# Patient Record
Sex: Female | Born: 1965 | Smoking: Former smoker
Health system: Southern US, Community
[De-identification: ages and names within clinical notes are randomized; demographics above are authoritative.]

---

## 2019-05-31 ENCOUNTER — Other Ambulatory Visit: Payer: Self-pay | Admitting: Infectious Diseases

## 2019-05-31 DIAGNOSIS — Z1231 Encounter for screening mammogram for malignant neoplasm of breast: Secondary | ICD-10-CM

## 2019-08-01 ENCOUNTER — Ambulatory Visit
Admission: RE | Admit: 2019-08-01 | Discharge: 2019-08-01 | Disposition: A | Discharge status: Home / Self Care | Payer: 59 | Source: Ambulatory Visit | Attending: Infectious Diseases | Admitting: Infectious Diseases

## 2019-08-01 DIAGNOSIS — Z1231 Encounter for screening mammogram for malignant neoplasm of breast: Secondary | ICD-10-CM | POA: Diagnosis present

## 2019-08-16 ENCOUNTER — Other Ambulatory Visit: Payer: Self-pay | Admitting: Infectious Diseases

## 2019-08-16 DIAGNOSIS — R928 Other abnormal and inconclusive findings on diagnostic imaging of breast: Secondary | ICD-10-CM

## 2019-08-16 DIAGNOSIS — N6489 Other specified disorders of breast: Secondary | ICD-10-CM

## 2019-08-31 ENCOUNTER — Ambulatory Visit: Payer: No Typology Code available for payment source

## 2019-08-31 ENCOUNTER — Other Ambulatory Visit: Payer: 59

## 2019-09-14 ENCOUNTER — Other Ambulatory Visit: Payer: Self-pay

## 2019-09-29 ENCOUNTER — Ambulatory Visit
Admission: RE | Admit: 2019-09-29 | Discharge: 2019-09-29 | Disposition: A | Discharge status: Home / Self Care | Payer: No Typology Code available for payment source | Source: Ambulatory Visit | Attending: Infectious Diseases | Admitting: Infectious Diseases

## 2019-09-29 DIAGNOSIS — N6489 Other specified disorders of breast: Secondary | ICD-10-CM

## 2019-09-29 DIAGNOSIS — R928 Other abnormal and inconclusive findings on diagnostic imaging of breast: Secondary | ICD-10-CM | POA: Diagnosis not present

## 2020-07-09 ENCOUNTER — Telehealth: Payer: Self-pay

## 2020-07-09 NOTE — Telephone Encounter (Signed)
Contacted patient for lung CT screening clinic based on referral from Dr. Sampson Goon.  Message left for patient to call Glenna Fellows, lung navigator to schedule CT scan.

## 2020-07-23 ENCOUNTER — Telehealth: Payer: Self-pay

## 2020-07-23 NOTE — Telephone Encounter (Signed)
Contacted patient for lung CT screening program based on referral from Dr. Sampson Goon.  Message left for patient to call Glenna Fellows, lung navigator to schedule CT scan appt.

## 2020-07-30 ENCOUNTER — Telehealth: Payer: Self-pay

## 2020-07-30 DIAGNOSIS — Z87891 Personal history of nicotine dependence: Secondary | ICD-10-CM

## 2020-07-30 DIAGNOSIS — Z122 Encounter for screening for malignant neoplasm of respiratory organs: Secondary | ICD-10-CM

## 2020-07-30 NOTE — Telephone Encounter (Signed)
Contacted patient for lung CT screening program as referred by Dr. Sampson Goon.  I spoke to patient and she is agreeable and CT has been scheduled for Wed. Dec 22 at 10:15.  Address to imaging center, phone number to Glenna Fellows, lung navigator and appt day and time will be texted to patient as she is currently driving.  Patient confirms she has [employer].  Patient is a former smoker who started smoking consistently at age 54.  She states she started "sneaking" her mom's cigarettes at age 17.  She smoked 2 to 2 1/2 packs per day, but closer to 2 on average.  She stopped smoking 5 years ago.

## 2020-07-31 NOTE — Telephone Encounter (Signed)
Former smoker, quit 2016, 76 pack year history.

## 2020-07-31 NOTE — Addendum Note (Signed)
Addended by: Jonne Ply on: 07/31/2020 09:30 AM   Modules accepted: Orders

## 2020-08-29 ENCOUNTER — Ambulatory Visit
Admission: RE | Admit: 2020-08-29 | Discharge: 2020-08-29 | Disposition: A | Discharge status: Home / Self Care | Payer: No Typology Code available for payment source | Source: Ambulatory Visit | Attending: Oncology | Admitting: Oncology

## 2020-08-29 ENCOUNTER — Other Ambulatory Visit: Payer: Self-pay

## 2020-08-29 ENCOUNTER — Inpatient Hospital Stay: Payer: No Typology Code available for payment source | Attending: Oncology | Admitting: Oncology

## 2020-08-29 ENCOUNTER — Encounter: Payer: Self-pay | Admitting: Oncology

## 2020-08-29 DIAGNOSIS — Z87891 Personal history of nicotine dependence: Secondary | ICD-10-CM | POA: Diagnosis not present

## 2020-08-29 DIAGNOSIS — Z122 Encounter for screening for malignant neoplasm of respiratory organs: Secondary | ICD-10-CM

## 2020-08-29 NOTE — Progress Notes (Signed)
Virtual Visit via Video Note  I connected with Stefanie Ramsey on 08/29/20 at 10:15 AM EST by a video enabled telemedicine application and verified that I am speaking with the correct person using two identifiers.  Location: Patient: [facility] Provider: Clinic    I discussed the limitations of evaluation and management by telemedicine and the availability of in person appointments. The patient expressed understanding and agreed to proceed.  I discussed the assessment and treatment plan with the patient. The patient was provided an opportunity to ask questions and all were answered. The patient agreed with the plan and demonstrated an understanding of the instructions.   The patient was advised to call back or seek an in-person evaluation if the symptoms worsen or if the condition fails to improve as anticipated.   In accordance with CMS guidelines, patient has met eligibility criteria including age, absence of signs or symptoms of lung cancer.  Social History   Tobacco Use  . Smoking status: Former Smoker    Packs/day: 2.00    Years: 38.00    Pack years: 76.00    Types: Cigarettes    Quit date: 2016    Years since quitting: 5.9      A shared decision-making session was conducted prior to the performance of CT scan. This includes one or more decision aids, includes benefits and harms of screening, follow-up diagnostic testing, over-diagnosis, false positive rate, and total radiation exposure.   Counseling on the importance of adherence to annual lung cancer LDCT screening, impact of co-morbidities, and ability or willingness to undergo diagnosis and treatment is imperative for compliance of the program.   Counseling on the importance of continued smoking cessation for former smokers; the importance of smoking cessation for current smokers, and information about tobacco cessation interventions have been given to patient including [place] and [facility] quit [place] programs.   Written  order for lung cancer screening with LDCT has been given to the patient and any and all questions have been answered to the best of my abilities.    Yearly follow up will be coordinated by Burgess Estelle, Thoracic Navigator.  I provided 15 minutes of non face-to-face telephone visit time during this encounter, and > 50% was spent counseling as documented under my assessment & plan.   Jacquelin Hawking, NP

## 2020-09-03 ENCOUNTER — Encounter: Payer: Self-pay | Admitting: *Deleted

## 2020-11-05 ENCOUNTER — Other Ambulatory Visit: Payer: Self-pay | Admitting: Infectious Diseases

## 2020-11-05 DIAGNOSIS — Z1231 Encounter for screening mammogram for malignant neoplasm of breast: Secondary | ICD-10-CM

## 2020-12-30 IMAGING — MG DIGITAL SCREENING BILAT W/ TOMO W/ CAD
6 of 10 series · 6 of 30 positions shown · non-contrast
Comparison: None could be obtained.

CLINICAL DATA: Screening.

EXAM:
DIGITAL SCREENING BILATERAL MAMMOGRAM WITH TOMO AND CAD

[R MLO synth-2D (1 of 2)]
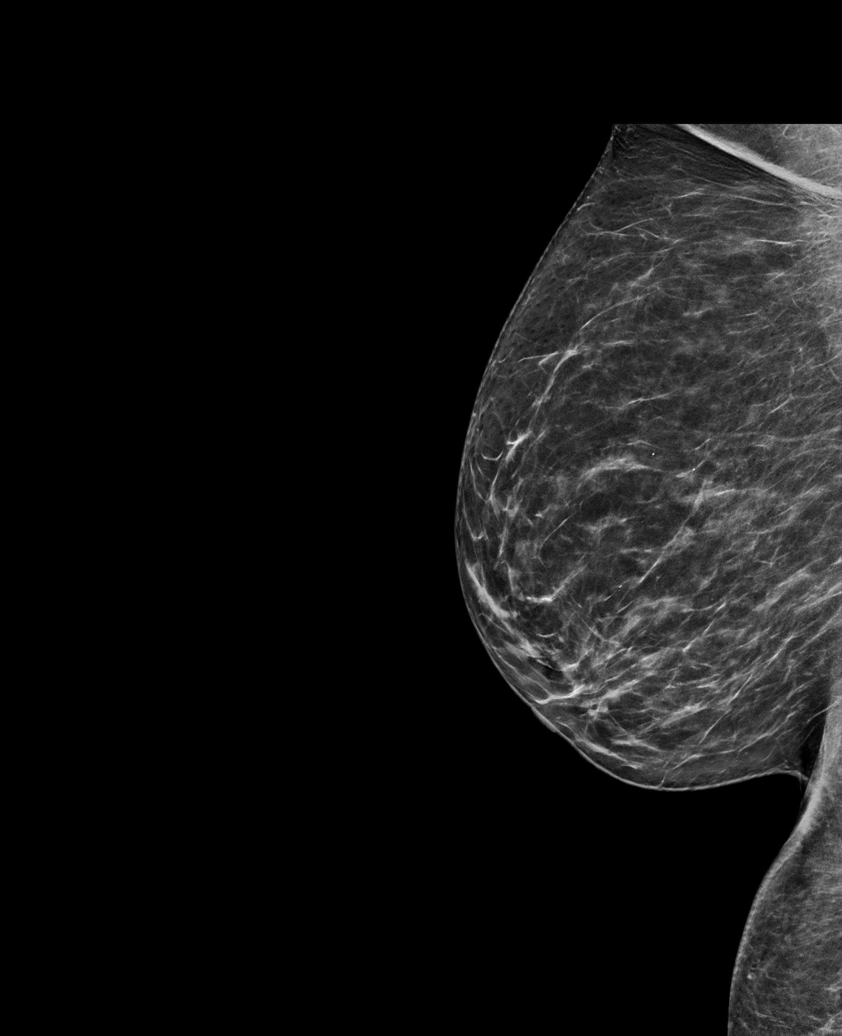

[R CC synth-2D]
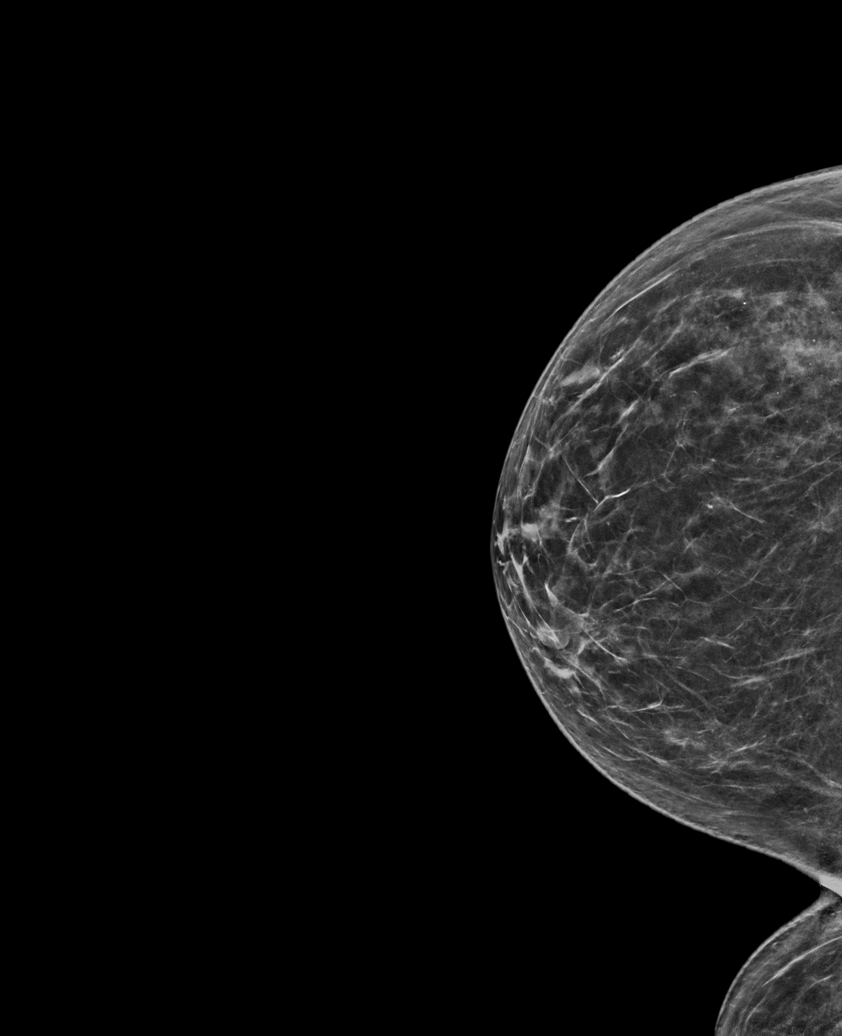

[L CC synth-2D]
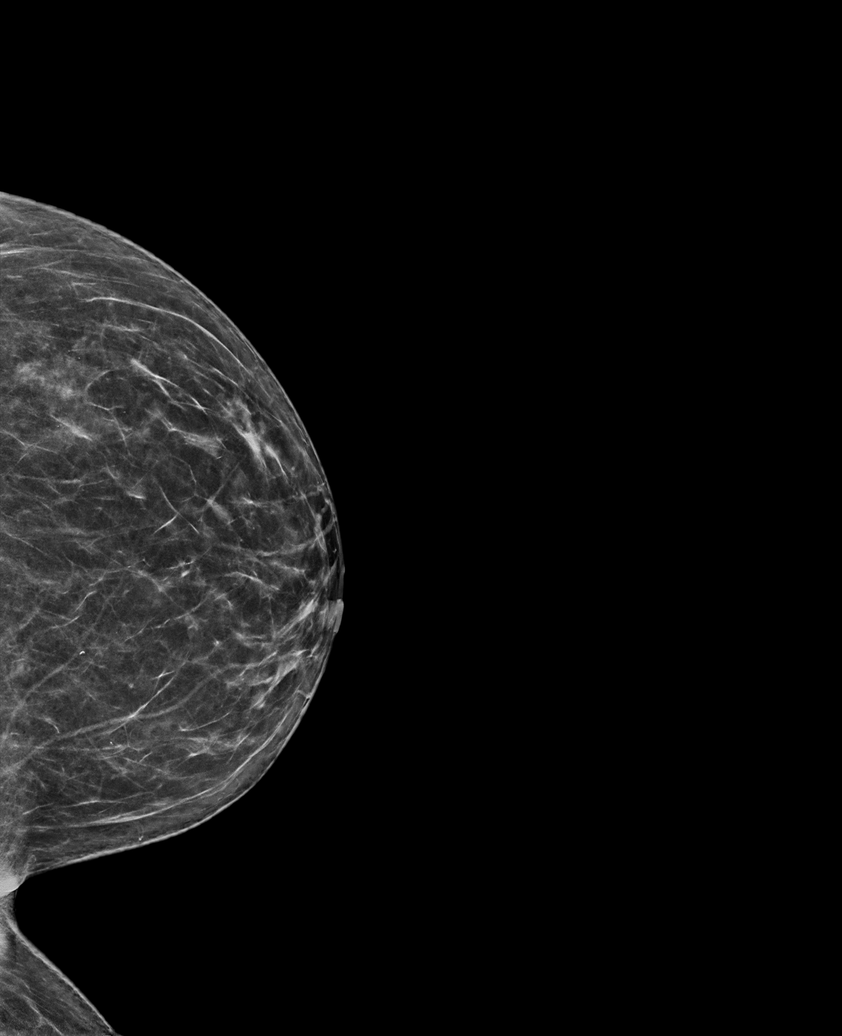

[L MLO synth-2D]
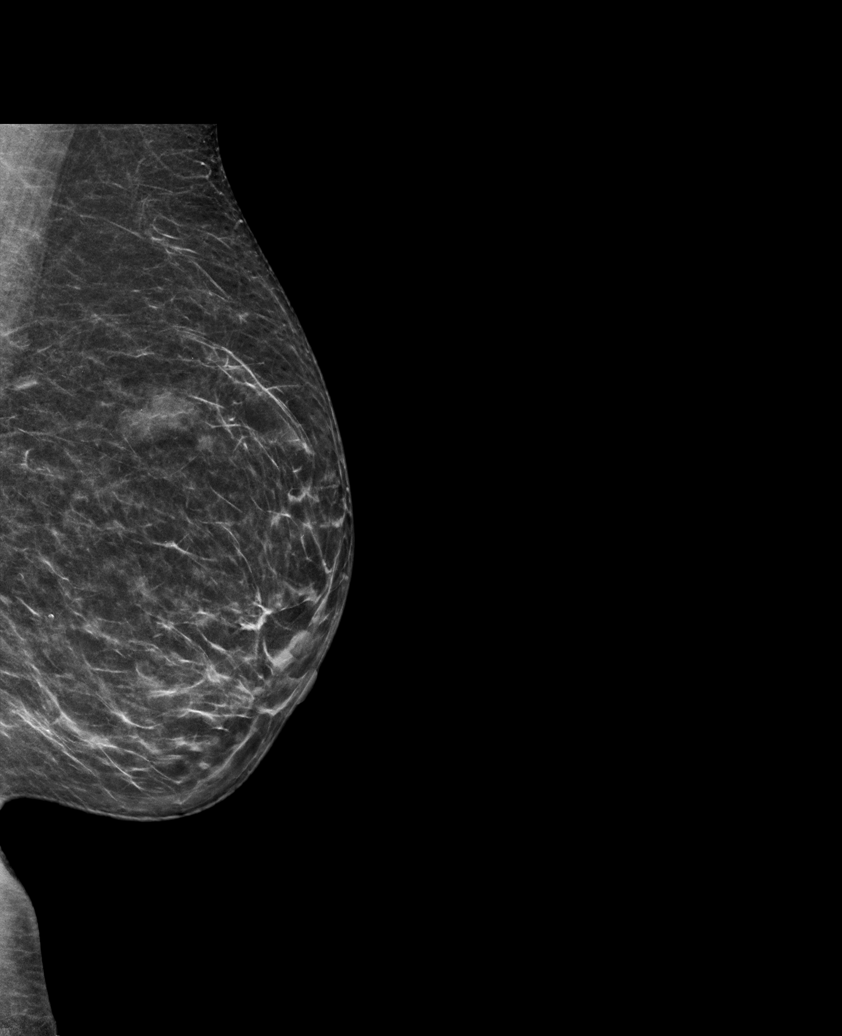

[R MLO synth-2D (2 of 2)]
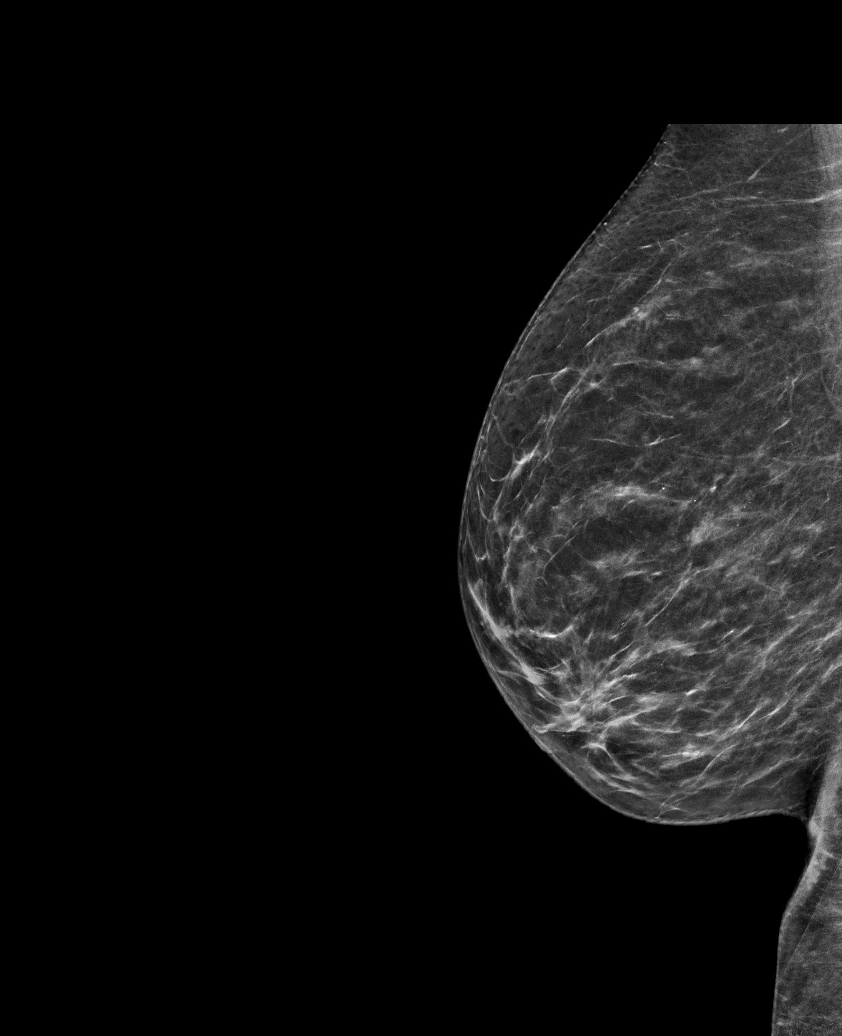

[R CC tomo · tomo slice 34/67.0]
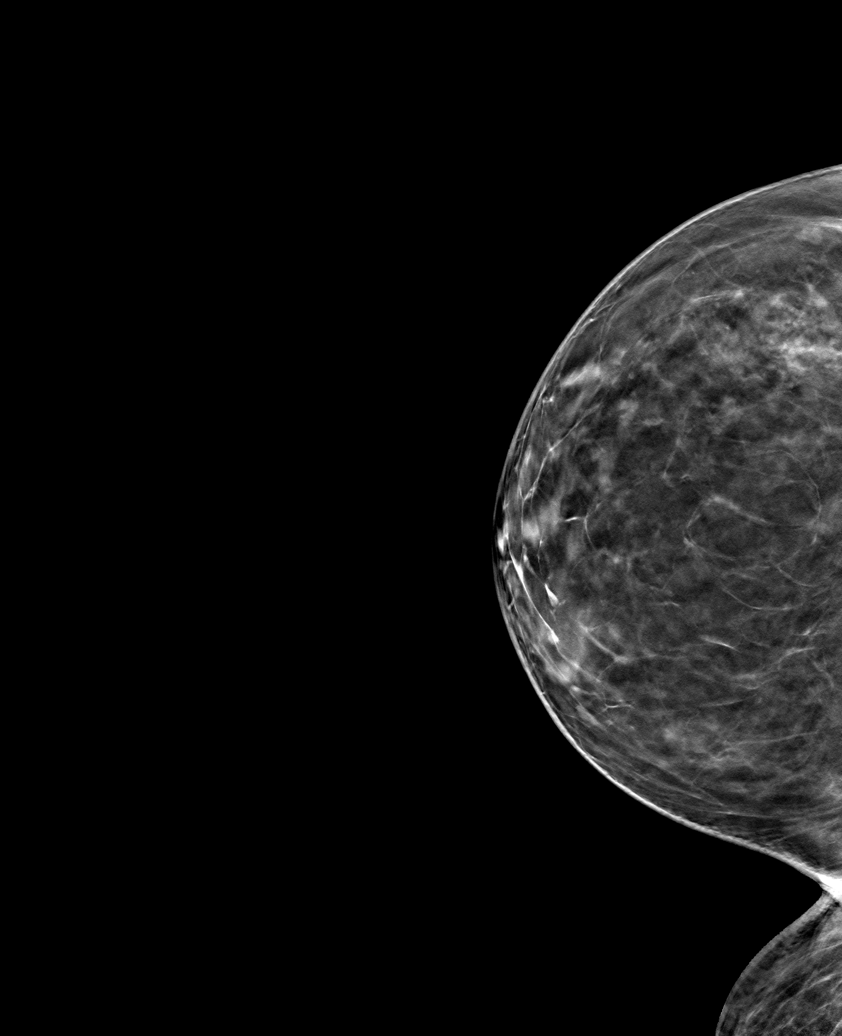

[6 of 30 positions shown; findings below may reference images not displayed]

ACR Breast Density Category b: There are scattered areas of
fibroglandular density.
FINDINGS: In the left breast, a possible asymmetry warrants further
evaluation. In the right breast, no findings suspicious for
malignancy. Images were processed with CAD.
IMPRESSION: Further evaluation is suggested for possible asymmetry in the left
breast.

RECOMMENDATION:
Diagnostic mammogram and possibly ultrasound of the left breast.
(Code:[id number])

The patient will be contacted regarding the findings, and additional
imaging will be scheduled.

BI-RADS CATEGORY  0: Incomplete. Need additional imaging evaluation
and/or prior mammograms for comparison.

## 2020-12-31 ENCOUNTER — Other Ambulatory Visit: Payer: Self-pay

## 2020-12-31 ENCOUNTER — Ambulatory Visit
Admission: RE | Admit: 2020-12-31 | Discharge: 2020-12-31 | Disposition: A | Discharge status: Home / Self Care | Payer: No Typology Code available for payment source | Source: Ambulatory Visit | Attending: Infectious Diseases | Admitting: Infectious Diseases

## 2020-12-31 DIAGNOSIS — Z1231 Encounter for screening mammogram for malignant neoplasm of breast: Secondary | ICD-10-CM | POA: Diagnosis present

## 2021-01-03 ENCOUNTER — Other Ambulatory Visit: Payer: Self-pay | Admitting: Infectious Diseases

## 2021-01-03 DIAGNOSIS — R928 Other abnormal and inconclusive findings on diagnostic imaging of breast: Secondary | ICD-10-CM

## 2021-01-03 DIAGNOSIS — N6489 Other specified disorders of breast: Secondary | ICD-10-CM

## 2021-01-03 DIAGNOSIS — R921 Mammographic calcification found on diagnostic imaging of breast: Secondary | ICD-10-CM

## 2021-01-08 ENCOUNTER — Ambulatory Visit
Admission: RE | Admit: 2021-01-08 | Discharge: 2021-01-08 | Disposition: A | Discharge status: Home / Self Care | Payer: No Typology Code available for payment source | Source: Ambulatory Visit | Attending: Infectious Diseases | Admitting: Infectious Diseases

## 2021-01-08 ENCOUNTER — Other Ambulatory Visit: Payer: Self-pay

## 2021-01-08 DIAGNOSIS — N6489 Other specified disorders of breast: Secondary | ICD-10-CM | POA: Insufficient documentation

## 2021-01-08 DIAGNOSIS — R928 Other abnormal and inconclusive findings on diagnostic imaging of breast: Secondary | ICD-10-CM | POA: Diagnosis present

## 2021-01-08 DIAGNOSIS — R921 Mammographic calcification found on diagnostic imaging of breast: Secondary | ICD-10-CM

## 2021-01-15 ENCOUNTER — Other Ambulatory Visit: Payer: Self-pay | Admitting: Infectious Diseases

## 2021-01-15 DIAGNOSIS — R928 Other abnormal and inconclusive findings on diagnostic imaging of breast: Secondary | ICD-10-CM

## 2021-01-15 DIAGNOSIS — R921 Mammographic calcification found on diagnostic imaging of breast: Secondary | ICD-10-CM

## 2021-01-17 HISTORY — PX: BREAST BIOPSY: SHX20

## 2021-01-18 ENCOUNTER — Ambulatory Visit
Admission: RE | Admit: 2021-01-18 | Discharge: 2021-01-18 | Disposition: A | Discharge status: Home / Self Care | Payer: No Typology Code available for payment source | Source: Ambulatory Visit | Attending: Infectious Diseases | Admitting: Infectious Diseases

## 2021-01-18 ENCOUNTER — Other Ambulatory Visit: Payer: Self-pay

## 2021-01-18 DIAGNOSIS — R928 Other abnormal and inconclusive findings on diagnostic imaging of breast: Secondary | ICD-10-CM

## 2021-01-18 DIAGNOSIS — R921 Mammographic calcification found on diagnostic imaging of breast: Secondary | ICD-10-CM | POA: Insufficient documentation

## 2021-01-18 HISTORY — PX: BREAST BIOPSY: SHX20

## 2021-01-21 LAB — SURGICAL PATHOLOGY

## 2021-02-27 IMAGING — MG MM DIGITAL DIAGNOSTIC UNILAT*L* W/ TOMO W/ CAD
4 series · 4 of 12 positions shown · non-contrast
Comparison: August 01, 2019 and June 10, 2014

CLINICAL DATA: 53-year-old patient with recent screening mammogram
for evaluation of an asymmetry in the left breast seen only in the
MLO projection.

EXAM:
DIGITAL DIAGNOSTIC UNILATERAL LEFT MAMMOGRAM WITH CAD AND TOMO

[L ML synth-2D]
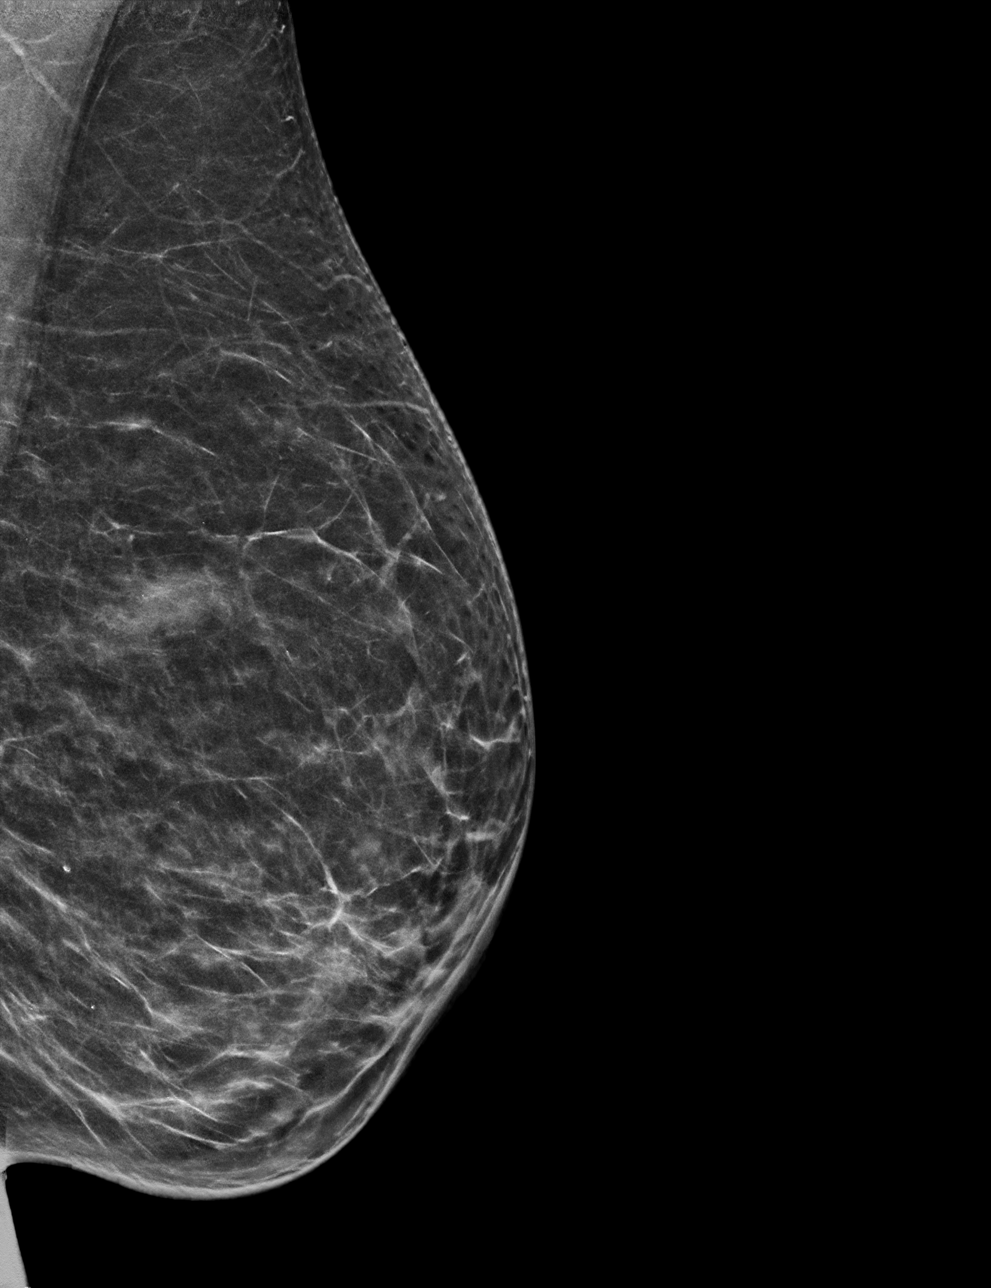

[L MLO synth-2D]
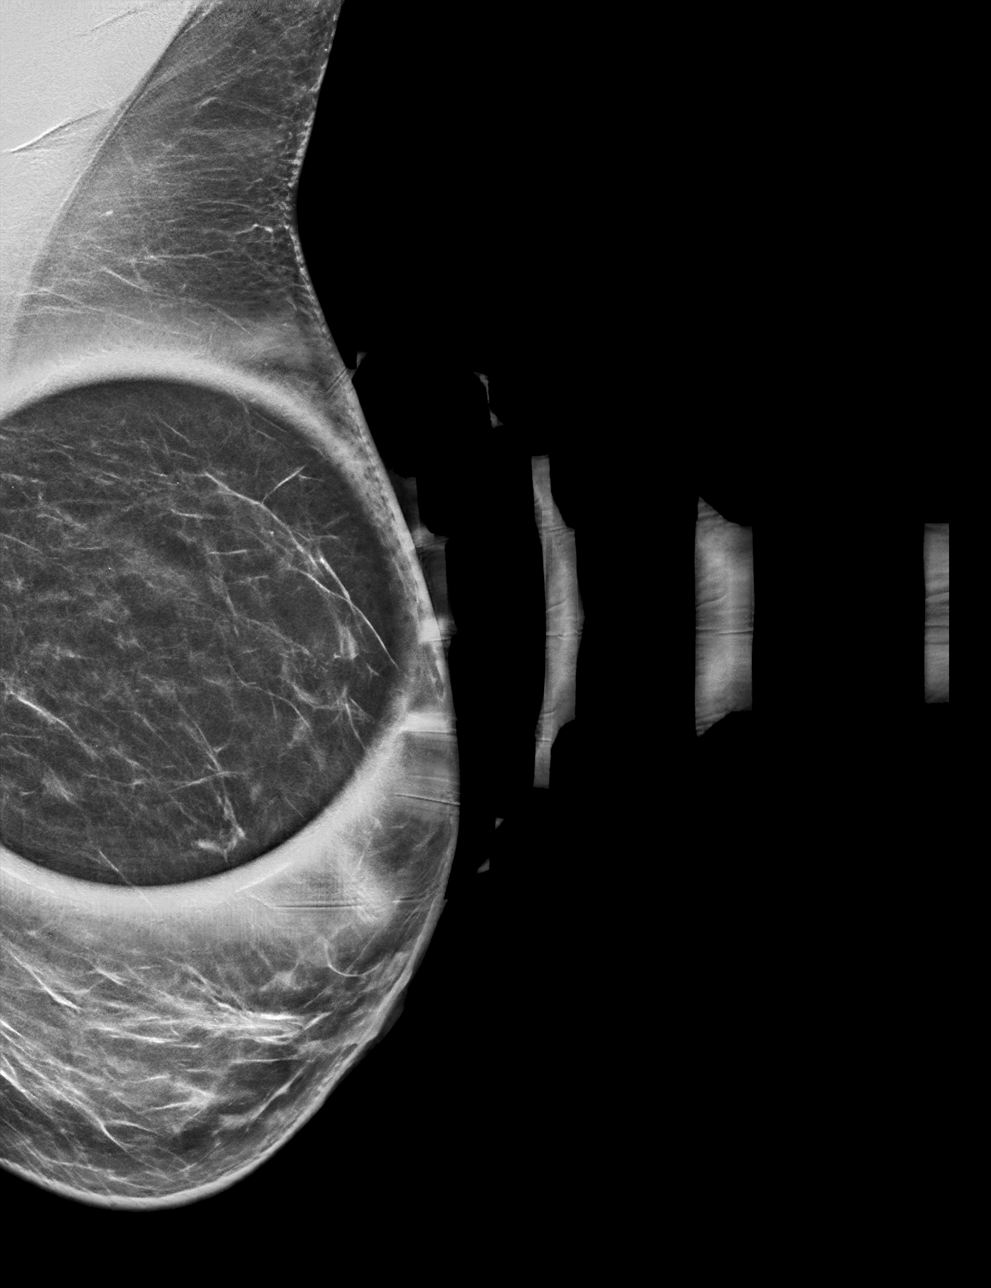

[L ML tomo · tomo slice 31/60.0]
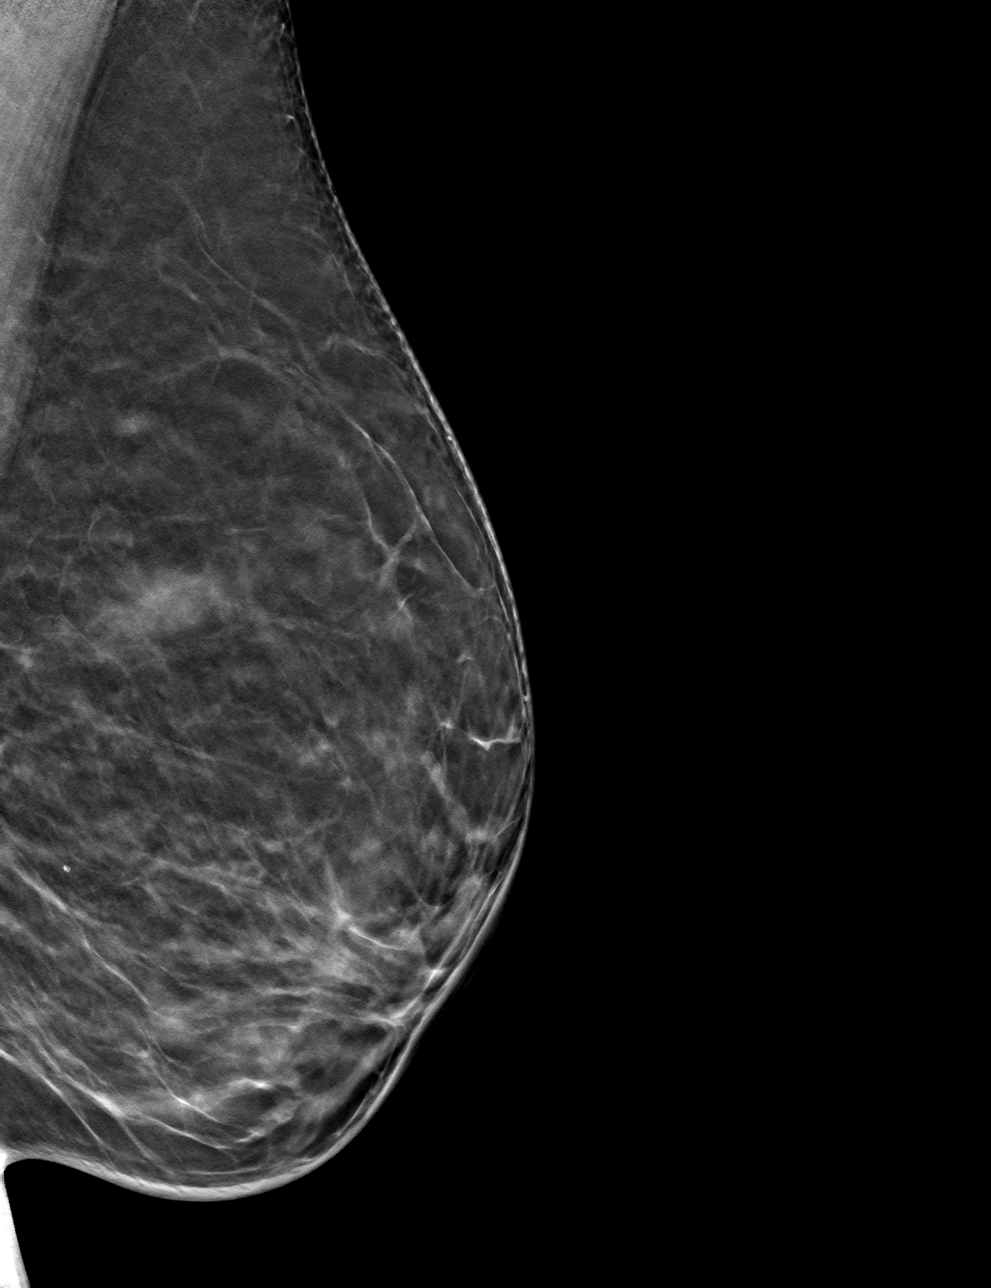

[L MLO tomo · tomo slice 31/61.0]
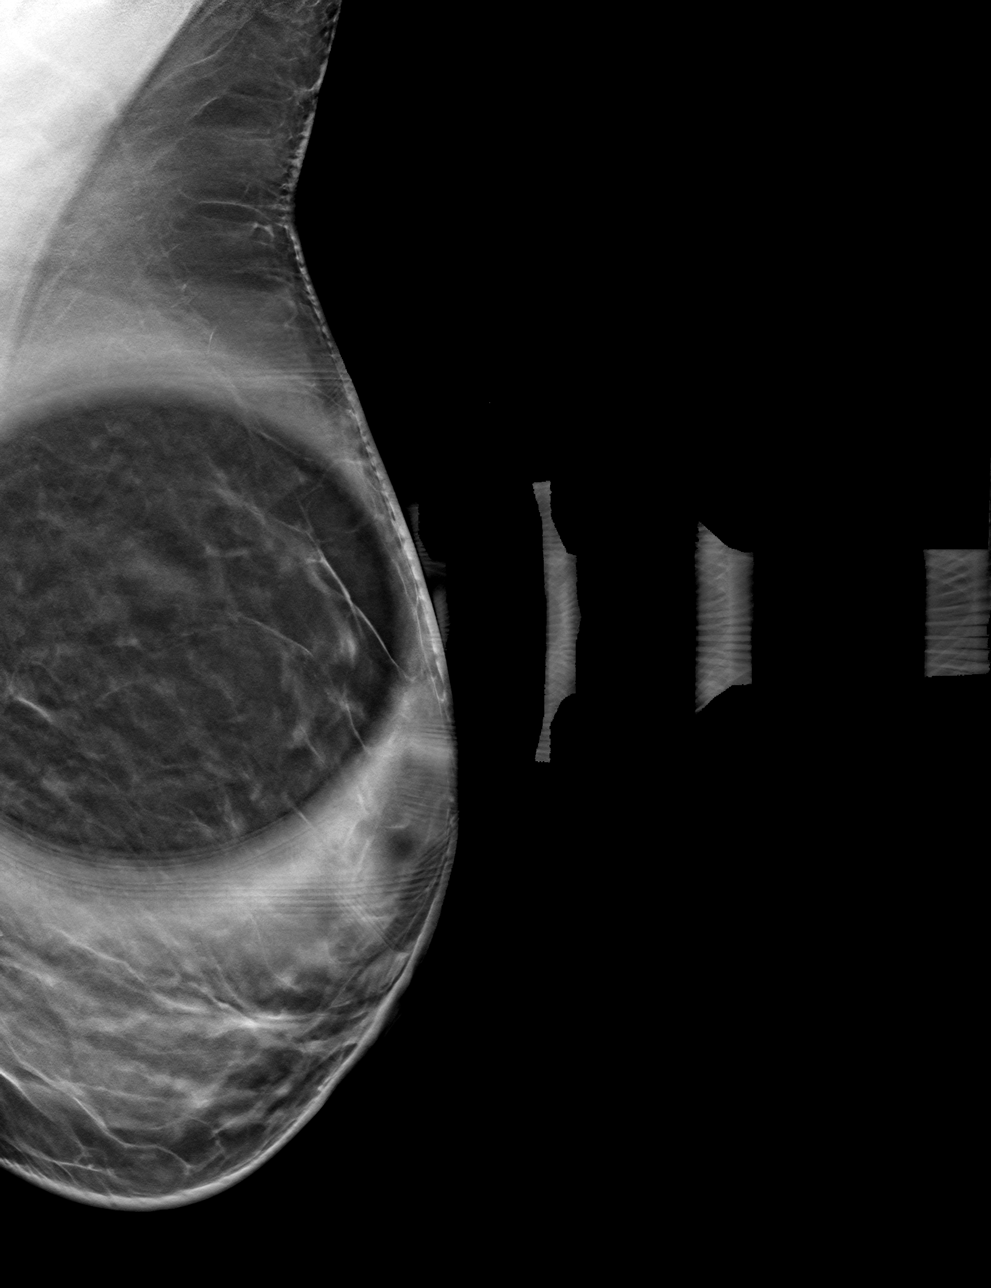

[4 of 12 positions shown; findings below may reference images not displayed]

ACR Breast Density Category b: There are scattered areas of
fibroglandular density.
FINDINGS: Spot compression view of the left breast in the MLO projection shows
dispersion of fibroglandular tissue with no persistent asymmetry,
mass, or distortion. 90 degree lateral view of the left breast shows
an island of glandular tissue in the superior left breast, with no
suspicious findings.

Mammographic images were processed with CAD.
IMPRESSION: No evidence of malignancy in the left breast.

RECOMMENDATION:
Screening mammogram in one year.(Code:[id number])

I have discussed the findings and recommendations with the patient.
If applicable, a reminder letter will be sent to the patient
regarding the next appointment.

BI-RADS CATEGORY  1: Negative.

## 2021-09-24 ENCOUNTER — Telehealth: Payer: Self-pay | Admitting: Acute Care

## 2021-09-24 NOTE — Telephone Encounter (Signed)
Left detailed message for pt to call back to schedule f/u lung screening CT.  °

## 2021-11-26 ENCOUNTER — Telehealth: Payer: Self-pay | Admitting: Acute Care

## 2021-11-26 NOTE — Telephone Encounter (Signed)
Left voicemail and call back number to call for scheduling of annual LDCT ?

## 2022-08-20 ENCOUNTER — Encounter: Payer: Self-pay | Admitting: *Deleted

## 2023-01-22 ENCOUNTER — Telehealth: Payer: Self-pay

## 2023-01-22 NOTE — Telephone Encounter (Signed)
Left Vm and call back number to call and schedule annual LDCT
